# Patient Record
Sex: Female | Born: 2000 | Race: White | Hispanic: No | Marital: Single
Health system: Southern US, Community
[De-identification: ages and names within clinical notes are randomized; demographics above are authoritative.]

---
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
## 2022-01-16 ENCOUNTER — Encounter: Payer: Self-pay | Admitting: Podiatry

## 2022-01-17 ENCOUNTER — Other Ambulatory Visit: Payer: Self-pay | Admitting: Podiatry

## 2022-01-17 ENCOUNTER — Encounter: Payer: Self-pay | Admitting: Podiatry

## 2022-01-17 MED ORDER — CEPHALEXIN 500 MG PO CAPS: 500.0000 mg | ORAL_CAPSULE | Freq: Three times a day (TID) | ORAL | 0 refills | Status: DC

## 2022-01-17 NOTE — Progress Notes (Signed)
error 

## 2022-01-17 NOTE — Telephone Encounter (Signed)
Morrie Sheldon, can you please help with this note? Thanks.

## 2022-01-18 ENCOUNTER — Telehealth: Payer: Self-pay | Admitting: Podiatry

## 2022-01-18 ENCOUNTER — Encounter: Payer: Self-pay | Admitting: Podiatry

## 2022-01-18 NOTE — Telephone Encounter (Signed)
Pt Mother Called in requesting the work note for her daughter.

## 2022-01-18 NOTE — Telephone Encounter (Signed)
Patient mother called and stated that her daughter foot has worsened and she knows Kelaiah has just started the antibiotics. She wanted to know if she should come into the office

## 2022-01-18 NOTE — Telephone Encounter (Signed)
I sent work note via Clinical cyt[employer]ist to pt

## 2022-01-19 ENCOUNTER — Encounter: Payer: Self-pay | Admitting: Podiatry

## 2022-01-19 ENCOUNTER — Ambulatory Visit (INDEPENDENT_AMBULATORY_CARE_PROVIDER_SITE_OTHER): Payer: BC Managed Care – PPO | Admitting: Podiatry

## 2022-01-19 DIAGNOSIS — L03032 Cellulitis of left toe: Secondary | ICD-10-CM | POA: Diagnosis not present

## 2022-01-19 DIAGNOSIS — L02612 Cutaneous abscess of left foot: Secondary | ICD-10-CM | POA: Diagnosis not present

## 2022-01-19 NOTE — Progress Notes (Signed)
Subjective: Chief Complaint  Patient presents with   Toe Pain    Pt has a abscess on the left hallux which started a mth ago rates the pain a 6 out of 75    21 year old female presents the office today for concerns of x-ray of the left hallux.  She sent a MyChart message as scheduled on antibiotics which she is still taking but seems to be getting worse.  No injuries that she reports.  She states that the nail has been removed she does not want to do it today.  No fevers or chills.  No other concerns.  Objective: AAO x3, NAD DP/PT pulses palpable bilaterally, CRT less than 3 seconds Left hallux nail is hypertrophic, dystrophic with brown discoloration.  There is what appears to be an abscess proximal to the nail fold.  Is localized edema and erythema.  Upon draining this there was purulence noted.  There is no ascending cellulitis. No pain with calf compression, swelling, warmth, erythema  Assessment: 21 year old female abscess  Plan: -All treatment options discussed with the patient including all alternatives, risks, complications.  -I discussed with her draining the abscess today.  She does agree with this.  Skin was cleaned with alcohol and I utilized a 22-gauge needle after I anesthetized with ethyl chloride to express purulence to drain the abscess.  She tolerated this well.  Compression bandage applied.  Discussed the possible need for nail removal.  Continue antibiotics, Epsom salt soaks for now. -Patient encouraged to call the office with any questions, concerns, change in symptoms.   Vivi Barrack DPM

## 2022-02-03 ENCOUNTER — Ambulatory Visit (INDEPENDENT_AMBULATORY_CARE_PROVIDER_SITE_OTHER): Payer: BC Managed Care – PPO | Admitting: Podiatry

## 2022-02-03 DIAGNOSIS — L02612 Cutaneous abscess of left foot: Secondary | ICD-10-CM | POA: Diagnosis not present

## 2022-02-03 DIAGNOSIS — L03032 Cellulitis of left toe: Secondary | ICD-10-CM | POA: Diagnosis not present

## 2022-02-03 MED ORDER — CEPHALEXIN 500 MG PO CAPS: 500.0000 mg | ORAL_CAPSULE | Freq: Three times a day (TID) | ORAL | 0 refills | Status: DC

## 2022-02-03 NOTE — Patient Instructions (Signed)

## 2022-02-05 NOTE — Progress Notes (Signed)
Subjective: Chief Complaint  Patient presents with   Toe Pain    Patient acme in today for a right hallux abscess, patient states the toe is doing better, denies any pain, still has some redness,  TX: Keflex (finished taking)  Patient also has bilateral nail fungus     21 year old female presents the office today for follow-up of infection to the left big toe.  She states is better but not completely resolved yet.  No drainage or pus that she reports.  She finished the course of antibiotics.  No recent injury or changes.  Objective: AAO x3, NAD DP/PT pulses palpable bilaterally, CRT less than 3 seconds Left hallux nail is hypertrophic, dystrophic with brown discoloration.  On the proximal nail fold there is still localized edema and erythema noted but appears to be improved.  There is no fluctuation.  No drainage or pus noted.  Mild tenderness palpation on exam.  No ascending cellulitis. No pain with calf compression, swelling, warmth, erythema  Assessment: 21 year old female abscess-improving  Plan: -All treatment options discussed with the patient including all alternatives, risks, complications.  -Although his improvement still present.  I discussed with her nail removal today but she wants to hold off on this.  We will refill antibiotics another round.  Continue Epsom salts soaks.  If there is any worsening no improvement would recommend to proceed with nail removal.  Vivi Barrack DPM

## 2022-03-10 ENCOUNTER — Ambulatory Visit: Payer: BC Managed Care – PPO | Admitting: Podiatry

## 2022-03-24 DIAGNOSIS — Z113 Encounter for screening for infections with a predominantly sexual mode of transmission: Secondary | ICD-10-CM | POA: Diagnosis not present

## 2022-03-24 DIAGNOSIS — Z01419 Encounter for gynecological examination (general) (routine) without abnormal findings: Secondary | ICD-10-CM | POA: Diagnosis not present

## 2022-09-20 ENCOUNTER — Telehealth: Payer: BC Managed Care – PPO | Admitting: Physician Assistant

## 2022-09-20 DIAGNOSIS — H938X9 Other specified disorders of ear, unspecified ear: Secondary | ICD-10-CM | POA: Diagnosis not present

## 2022-09-20 MED ORDER — FLUTICASONE PROPIONATE 50 MCG/ACT NA SUSP: 2.0000 | Freq: Every day | NASAL | 0 refills | Status: DC

## 2022-09-20 NOTE — Progress Notes (Signed)
E-Visit for Upper Respiratory Infection   We are sorry you are not feeling well.  Here is how we plan to help!  Based on what you have shared with me, it looks like you may have a viral upper respiratory infection.  Upper respiratory infections are caused by a large number of viruses; however, rhinovirus is the most common cause.   With regards to the ear pain, this is likely caused from eustachian tube dysfunction from your upper respiratory symptoms. This should improved as your  upper respiratory infection improves. Some medications that can help are nasal decongestants which will open your sinuses and allow fluid to drain in your sinuses more easily. You can also take an over the counter decongestant.  I have prescribed fluticasone nasal spray to help with this.  Symptoms vary from person to person, with common symptoms including sore throat, cough, fatigue or lack of energy and feeling of general discomfort.  A low-grade fever of up to 100.4 may present, but is often uncommon.  Symptoms vary however, and are closely related to a person's age or underlying illnesses.  The most common symptoms associated with an upper respiratory infection are nasal discharge or congestion, cough, sneezing, headache and pressure in the ears and face.  These symptoms usually persist for about 3 to 10 days, but can last up to 2 weeks.  It is important to know that upper respiratory infections do not cause serious illness or complications in most cases.    Upper respiratory infections can be transmitted from person to person, with the most common method of transmission being a person's hands.  The virus is able to live on the skin and can infect other persons for up to 2 hours after direct contact.  Also, these can be transmitted when someone coughs or sneezes; thus, it is important to cover the mouth to reduce this risk.  To keep the spread of the illness at bay, good hand hygiene is very important.  This is an  infection that is most likely caused by a virus. There are no specific treatments other than to help you with the symptoms until the infection runs its course.  We are sorry you are not feeling well.  Here is how we plan to help!  If you do not have a history of heart disease, hypertension, diabetes or thyroid disease, prostate/bladder issues or glaucoma, you may also use Sudafed to treat nasal congestion.  It is highly recommended that you consult with a pharmacist or your primary care physician to ensure this medication is safe for you to take.     If you have a cough, you may use cough suppressants such as Delsym and Robitussin.  If you have glaucoma or high blood pressure, you can also use Coricidin HBP.    If you have a sore or scratchy throat, use a saltwater gargle-  to  teaspoon of salt dissolved in a 4-ounce to 8-ounce glass of warm water.  Gargle the solution for approximately 15-30 seconds and then spit.  It is important not to swallow the solution.  You can also use throat lozenges/cough drops and Chloraseptic spray to help with throat pain or discomfort.  Warm or cold liquids can also be helpful in relieving throat pain.  For headache, pain or general discomfort, you can use Ibuprofen or Tylenol as directed.   Some authorities believe that zinc sprays or the use of Echinacea may shorten the course of your symptoms.   HOME CARE Only  take medications as instructed by your medical team. Be sure to drink plenty of fluids. Water is fine as well as fruit juices, sodas and electrolyte beverages. You may want to stay away from caffeine or alcohol. If you are nauseated, try taking small sips of liquids. How do you know if you are getting enough fluid? Your urine should be a pale yellow or almost colorless. Get rest. Taking a steamy shower or using a humidifier may help nasal congestion and ease sore throat pain. You can place a towel over your head and breathe in the steam from hot water coming  from a faucet. Using a saline nasal spray works much the same way. Cough drops, hard candies and sore throat lozenges may ease your cough. Avoid close contacts especially the very young and the elderly Cover your mouth if you cough or sneeze Always remember to wash your hands.   GET HELP RIGHT AWAY IF: You develop worsening fever. If your symptoms do not improve within 10 days You develop yellow or green discharge from your nose over 3 days. You have coughing fits You develop a severe head ache or visual changes. You develop shortness of breath, difficulty breathing or start having chest pain Your symptoms persist after you have completed your treatment plan  MAKE SURE YOU  Understand these instructions. Will watch your condition. Will get help right away if you are not doing well or get worse.  Thank you for choosing an e-visit.  Your e-visit answers were reviewed by a board certified advanced clinical practitioner to complete your personal care plan. Depending upon the condition, your plan could have included both over the counter or prescription medications.  Please review your pharmacy choice. Make sure the pharmacy is open so you can pick up prescription now. If there is a problem, you may contact your provider through [employer] and have the prescription routed to another pharmacy.  Your safety is important to [place]. If you have drug allergies check your prescription carefully.   For the next 24 hours you can use MyChart to ask questions about today's visit, request a non-urgent call back, or ask for a work or school excuse. You will get an email in the next two days asking about your experience. I hope that your e-visit has been valuable and will speed your recovery.   Approximately 5 minutes was spent documenting and reviewing patient's chart.

## 2022-10-15 ENCOUNTER — Telehealth: Payer: BC Managed Care – PPO | Admitting: Family Medicine

## 2022-10-15 DIAGNOSIS — B9689 Other specified bacterial agents as the cause of diseases classified elsewhere: Secondary | ICD-10-CM

## 2022-10-15 DIAGNOSIS — J019 Acute sinusitis, unspecified: Secondary | ICD-10-CM

## 2022-10-15 MED ORDER — DOXYCYCLINE HYCLATE 100 MG PO TABS: 100.0000 mg | ORAL_TABLET | Freq: Two times a day (BID) | ORAL | 0 refills | Status: DC

## 2022-10-15 NOTE — Progress Notes (Signed)

## 2022-10-17 MED ORDER — AMOXICILLIN-POT CLAVULANATE 875-125 MG PO TABS: 1.0000 | ORAL_TABLET | Freq: Two times a day (BID) | ORAL | 0 refills | Status: DC

## 2022-10-17 NOTE — Addendum Note (Signed)
Addended by: Margaretann Loveless on: 10/17/2022 12:16 PM   Modules accepted: Orders

## 2023-03-30 DIAGNOSIS — Z113 Encounter for screening for infections with a predominantly sexual mode of transmission: Secondary | ICD-10-CM | POA: Diagnosis not present

## 2023-03-30 DIAGNOSIS — Z01419 Encounter for gynecological examination (general) (routine) without abnormal findings: Secondary | ICD-10-CM | POA: Diagnosis not present

## 2023-03-30 DIAGNOSIS — Z1331 Encounter for screening for depression: Secondary | ICD-10-CM | POA: Diagnosis not present

## 2023-04-28 ENCOUNTER — Telehealth: Payer: BC Managed Care – PPO | Admitting: Physician Assistant

## 2023-04-28 DIAGNOSIS — J019 Acute sinusitis, unspecified: Secondary | ICD-10-CM

## 2023-04-28 DIAGNOSIS — B9689 Other specified bacterial agents as the cause of diseases classified elsewhere: Secondary | ICD-10-CM

## 2023-04-28 MED ORDER — AMOXICILLIN-POT CLAVULANATE 875-125 MG PO TABS: 1.0000 | ORAL_TABLET | Freq: Two times a day (BID) | ORAL | 0 refills | Status: DC

## 2023-04-28 NOTE — Progress Notes (Signed)
E-Visit for Sinus Problems  We are sorry that you are not feeling well.  Here is how we plan to help!  Based on what you have shared with me it looks like you have sinusitis.  Sinusitis is inflammation and infection in the sinus cavities of the head.  Based on your presentation I believe you most likely have Acute Bacterial Sinusitis.  This is an infection caused by bacteria and is treated with antibiotics. I have prescribed Augmentin 875mg/125mg one tablet twice daily with food, for 7 days. You may use an oral decongestant such as Mucinex D or if you have glaucoma or high blood pressure use plain Mucinex. Saline nasal spray help and can safely be used as often as needed for congestion.  If you develop worsening sinus pain, fever or notice severe headache and vision changes, or if symptoms are not better after completion of antibiotic, please schedule an appointment with a health care provider.    Sinus infections are not as easily transmitted as other respiratory infection, however we still recommend that you avoid close contact with loved ones, especially the very [place] and elderly.  Remember to wash your hands thoroughly throughout the day as this is the number one way to prevent the spread of infection!  Home Care: Only take medications as instructed by your medical team. Complete the entire course of an antibiotic. Do not take these medications with alcohol. A steam or ultrasonic humidifier can help congestion.  You can place a towel over your head and breathe in the steam from hot water coming from a faucet. Avoid close contacts especially the very [place] and the elderly. Cover your mouth when you cough or sneeze. Always remember to wash your hands.  Get Help Right Away If: You develop worsening fever or sinus pain. You develop a severe head ache or visual changes. Your symptoms persist after you have completed your treatment plan.  Make sure you Understand these instructions. Will watch  your condition. Will get help right away if you are not doing well or get worse.  Thank you for choosing an e-visit.  Your e-visit answers were reviewed by a board certified advanced clinical practitioner to complete your personal care plan. Depending upon the condition, your plan could have included both over the counter or prescription medications.  Please review your pharmacy choice. Make sure the pharmacy is open so you can pick up prescription now. If there is a problem, you may contact your provider through MyChart messaging and have the prescription routed to another pharmacy.  Your safety is important to us. If you have drug allergies check your prescription carefully.   For the next 24 hours you can use MyChart to ask questions about today's visit, request a non-urgent call back, or ask for a work or school excuse. You will get an email in the next two days asking about your experience. I hope that your e-visit has been valuable and will speed your recovery.   I have spent 5 minutes in review of e-visit questionnaire, review and updating patient chart, medical decision making and response to patient.   Xoey Warmoth Z Ward, PA-C    

## 2023-06-04 DIAGNOSIS — L578 Other skin changes due to chronic exposure to nonionizing radiation: Secondary | ICD-10-CM | POA: Diagnosis not present

## 2023-06-04 DIAGNOSIS — L7211 Pilar cyst: Secondary | ICD-10-CM | POA: Diagnosis not present

## 2023-07-20 DIAGNOSIS — L7211 Pilar cyst: Secondary | ICD-10-CM | POA: Diagnosis not present

## 2023-08-08 IMAGING — MR MR ANKLE*R* W/O CM
5 series · 40 of 40 positions shown · non-contrast
Comparison: None.

CLINICAL DATA: Bilateral lateral foot pain and ankle pain for
years.

EXAM:
MRI OF THE RIGHT ANKLE WITHOUT CONTRAST
TECHNIQUE: Multiplanar, multisequence MR imaging of the ankle was performed. No
intravenous contrast was administered.

[Series 4: T2 fat-sat · axial · 3.0mm · 0.50mm/px · z∈[-116,+5]mm · 10 of 32 slices shown (1 of 2)]
[im 1/32]
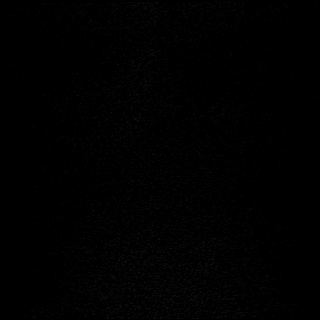
[im 4/32]
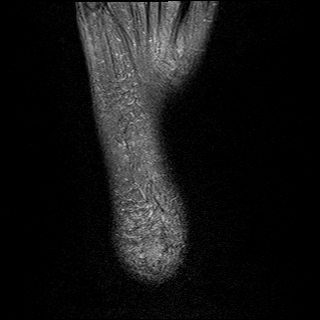
[im 7/32]
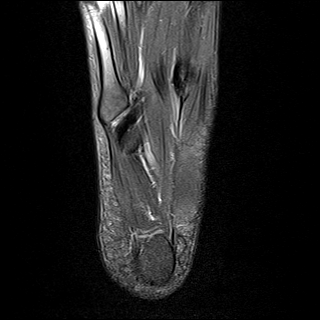
[im 11/32]
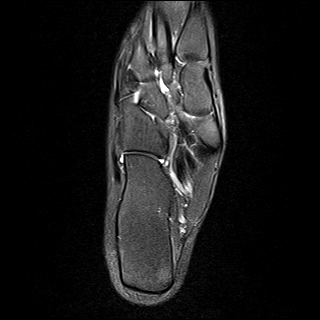
[im 14/32]
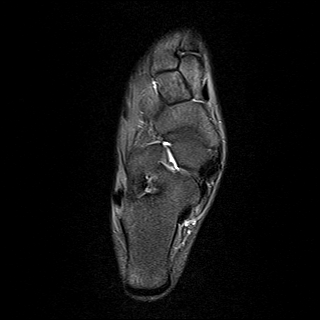
[im 18/32]
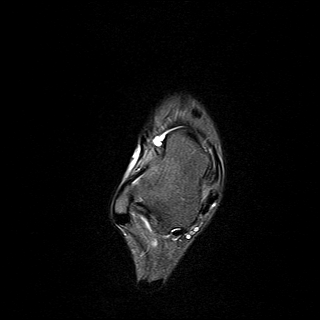
[im 21/32]
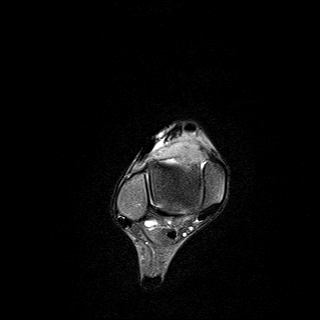
[im 25/32]
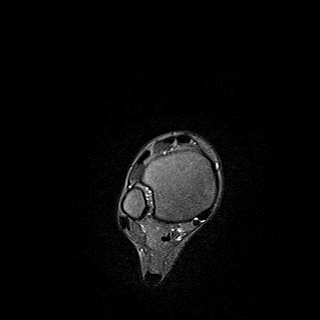
[im 28/32]
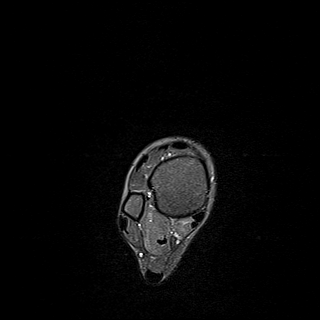
[im 32/32]
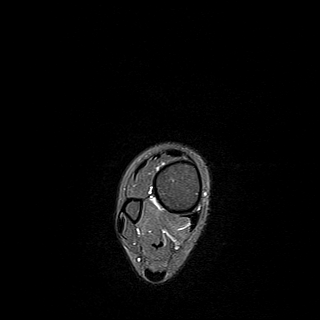

[Series 5: PD fat-sat · axial · 3.0mm · 0.50mm/px · z∈[-116,+5]mm · 9 of 32 slices shown]
[im 1/32]
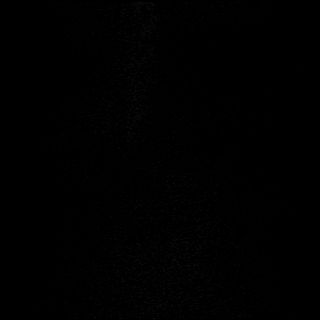
[im 4/32]
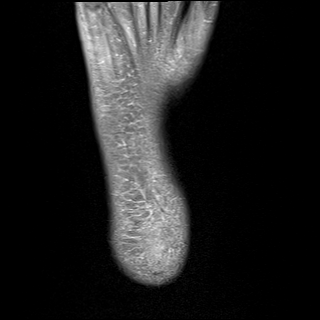
[im 8/32]
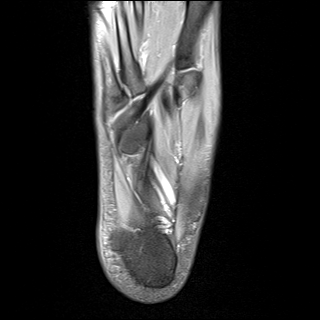
[im 12/32]
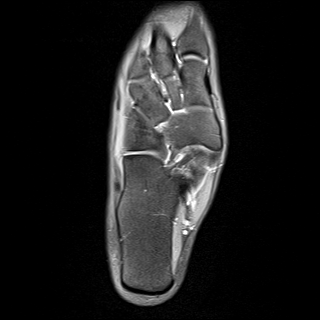
[im 16/32]
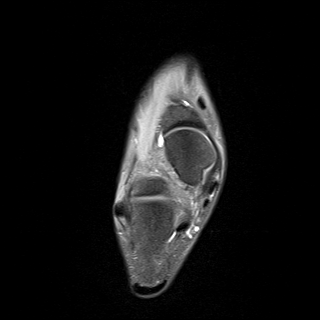
[im 20/32]
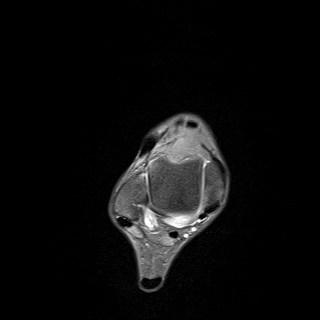
[im 24/32]
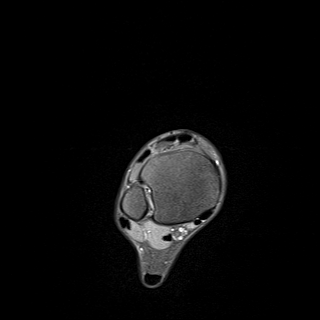
[im 28/32]
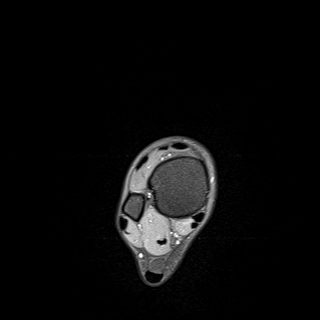
[im 32/32]
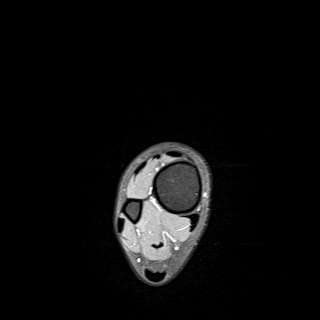

[Series 6: T1 · sagittal · 4.0mm · 0.56mm/px · 5 of 18 slices shown]
[im 1/18]
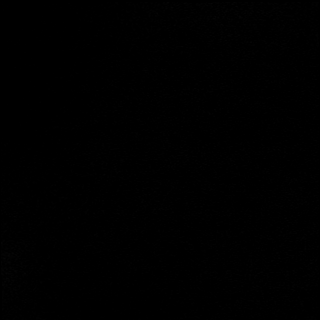
[im 5/18]
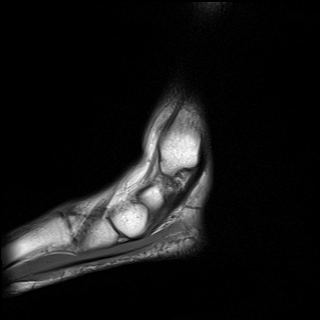
[im 9/18]
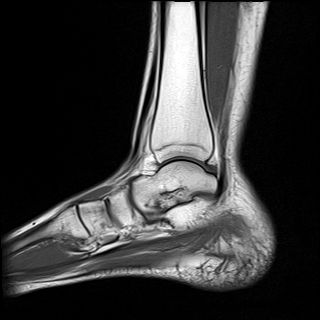
[im 13/18]
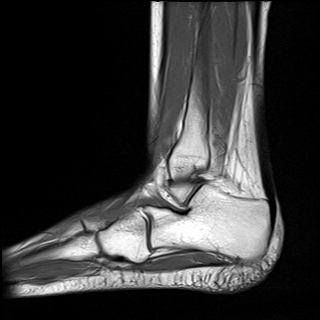
[im 18/18]
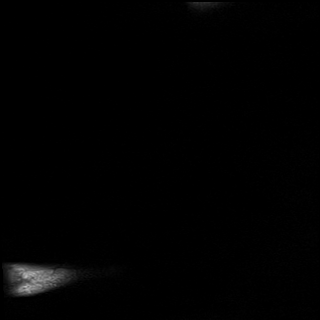

[Series 7: STIR · sagittal · 4.0mm · 0.35mm/px · 5 of 18 slices shown]
[im 1/18]
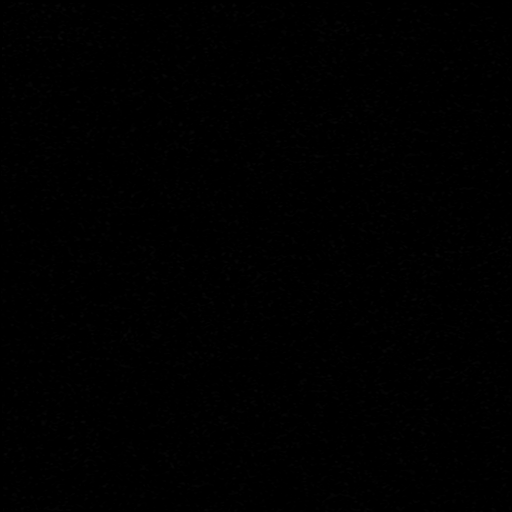
[im 5/18]
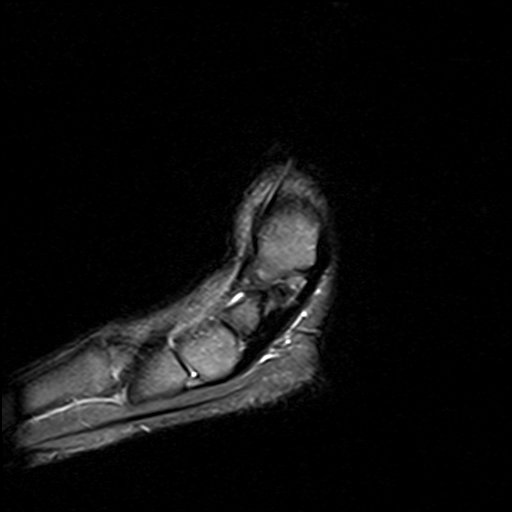
[im 9/18]
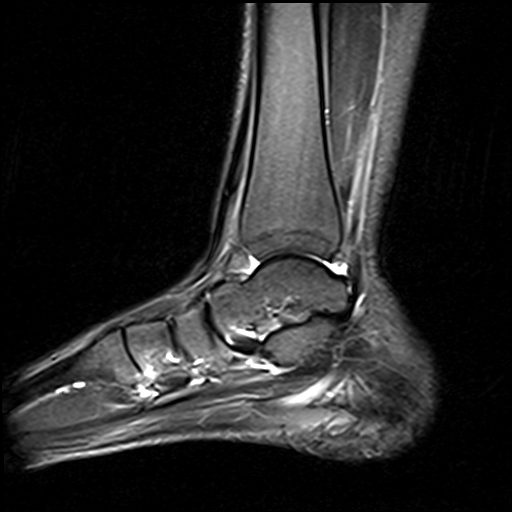
[im 13/18]
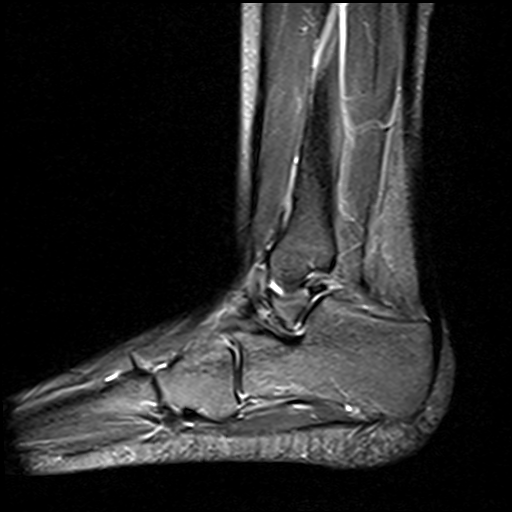
[im 18/18]
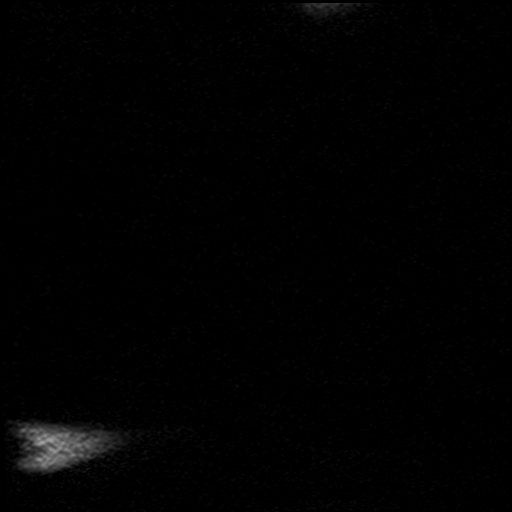

[Series 8: T2 fat-sat · coronal · 3.0mm · 0.50mm/px · 11 of 37 slices shown (2 of 2)]
[im 1/37]
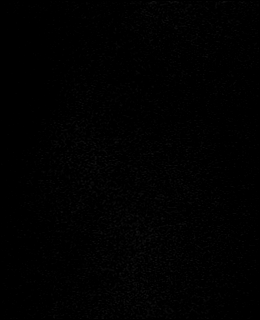
[im 4/37]
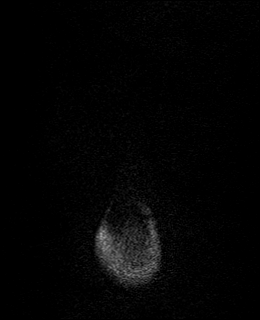
[im 8/37]
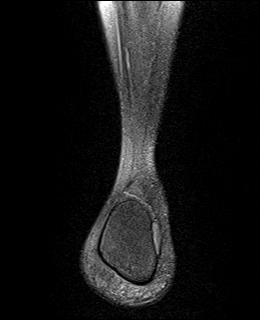
[im 11/37]
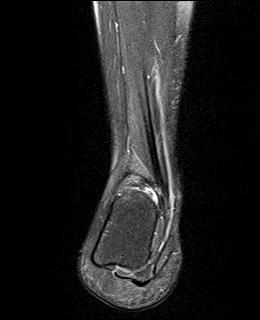
[im 15/37]
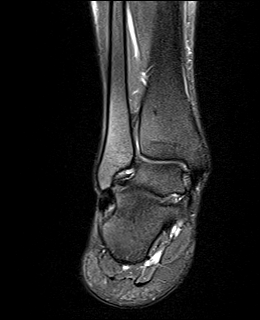
[im 19/37]
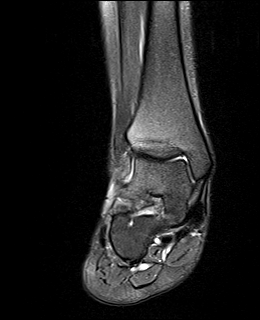
[im 22/37]
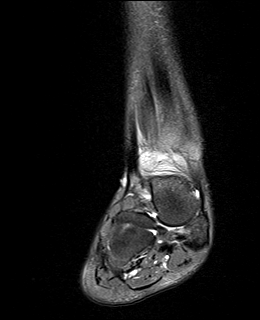
[im 26/37]
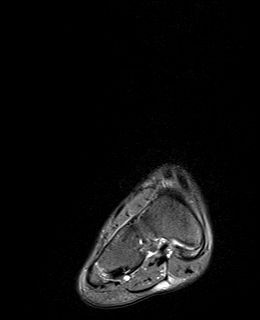
[im 29/37]
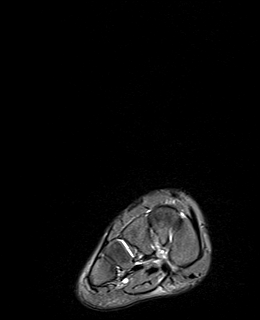
[im 33/37]
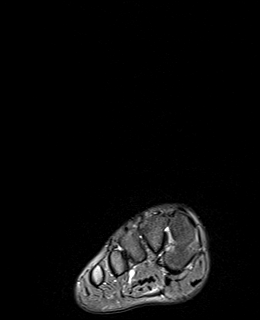
[im 37/37]
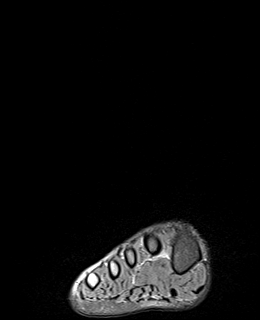

[40 of 40 positions shown; findings below may reference images not displayed]

FINDINGS: TENDONS

Peroneal: Peroneal longus tendon intact. Peroneal brevis intact.

Posteromedial: Posterior tibial tendon intact. Flexor hallucis
longus tendon intact. Flexor digitorum longus tendon intact.

Anterior: Tibialis anterior tendon intact. Extensor hallucis longus
tendon intact Extensor digitorum longus tendon intact.

Achilles:  Intact.

Plantar Fascia: Intact.

LIGAMENTS

Lateral: Anterior talofibular ligament intact. Calcaneofibular
ligament intact. Posterior talofibular ligament intact. Anterior and
posterior tibiofibular ligaments intact.

Medial: Deltoid ligament intact. Spring ligament intact.

CARTILAGE

Ankle Joint: No joint effusion. Normal ankle mortise. No chondral
defect.

Subtalar Joints/Sinus Tarsi: Normal subtalar joints. No subtalar
joint effusion. Normal sinus tarsi.

Bones: No marrow signal abnormality.  No fracture or dislocation.

Soft Tissue: No fluid collection or hematoma. Muscles are normal
without edema or atrophy. Tarsal tunnel is normal.
IMPRESSION: 1. No internal derangement of the left ankle.
2.  No acute osseous injury of the left ankle.
3. No evidence of tarsal coalition.

## 2023-08-31 DIAGNOSIS — L7211 Pilar cyst: Secondary | ICD-10-CM | POA: Diagnosis not present

## 2023-09-13 ENCOUNTER — Encounter: Payer: Self-pay | Admitting: Family Medicine

## 2023-09-13 ENCOUNTER — Telehealth: Admitting: Family Medicine

## 2023-09-13 DIAGNOSIS — R0982 Postnasal drip: Secondary | ICD-10-CM

## 2023-09-13 DIAGNOSIS — J029 Acute pharyngitis, unspecified: Secondary | ICD-10-CM | POA: Diagnosis not present

## 2023-09-13 DIAGNOSIS — J301 Allergic rhinitis due to pollen: Secondary | ICD-10-CM

## 2023-09-13 NOTE — Progress Notes (Signed)
 E visit for Allergic Rhinitis We are sorry that you are not feeling well.  Here is how we plan to help!  Based on what you have shared with me it looks like you have Allergic Rhinitis.  Rhinitis is when a reaction occurs that causes nasal congestion, runny nose, sneezing, and itching.  Most types of rhinitis are caused by an inflammation and are associated with symptoms in the eyes ears or throat. There are several types of rhinitis.  The most common are acute rhinitis, which is usually caused by a viral illness, allergic or seasonal rhinitis, and nonallergic or year-round rhinitis.  Nasal allergies occur certain times of the year.  Allergic rhinitis is caused when allergens in the air trigger the release of histamine in the body.  Histamine causes itching, swelling, and fluid to build up in the fragile linings of the nasal passages, sinuses and eyelids.  An itchy nose and clear discharge are common.  I recommend the following over the counter treatments: You should take a daily dose of antihistamine and Xyzal 5 mg take 1 tablet daily  Stop the Allerga   I also would recommend a nasal spray: Flonase 2 sprays into each nostril once daily and Saline 1 spray into each nostril as needed  You may also benefit from eye drops such as: Pataday eye drops.   Mucinex can help pull the each drainage out.  HOME CARE:  You can use an over-the-counter saline nasal spray as needed Avoid areas where there is heavy dust, mites, or molds Stay indoors on windy days during the pollen season Keep windows closed in home, at least in bedroom; use air conditioner. Use high-efficiency house air filter Keep windows closed in car, turn AC on re-circulate Avoid playing out with dog during pollen season  GET HELP RIGHT AWAY IF:  If your symptoms do not improve within 10 days You become short of breath You develop yellow or green discharge from your nose for over 3 days You have coughing fits  MAKE SURE  YOU:  Understand these instructions Will watch your condition Will get help right away if you are not doing well or get worse  Thank you for choosing an e-visit. Your e-visit answers were reviewed by a board certified advanced clinical practitioner to complete your personal care plan. Depending upon the condition, your plan could have included both over the counter or prescription medications. Please review your pharmacy choice. Be sure that the pharmacy you have chosen is open so that you can pick up your prescription now.  If there is a problem you may message your provider in MyChart to have the prescription routed to another pharmacy. Your safety is important to [place]. If you have drug allergies check your prescription carefully.  For the next 24 hours, you can use MyChart to ask questions about today's visit, request a non-urgent call back, or ask for a work or school excuse from your e-visit provider. You will get an email in the next two days asking about your experience. I hope that your e-visit has been valuable and will speed your recovery.       I provided 5 minutes of non face-to-face time during this encounter for chart review, medication and order placement, as well as and documentation.

## 2024-04-11 DIAGNOSIS — Z01419 Encounter for gynecological examination (general) (routine) without abnormal findings: Secondary | ICD-10-CM | POA: Diagnosis not present
